# Patient Record
Sex: Male | Born: 1995 | Race: White | Hispanic: No | Marital: Single | Smoking: Never smoker
Health system: Southern US, Community
[De-identification: ages and names within clinical notes are randomized; demographics above are authoritative.]

## PROBLEM LIST (undated history)

## (undated) DIAGNOSIS — R519 Headache, unspecified: Secondary | ICD-10-CM

## (undated) DIAGNOSIS — R51 Headache: Secondary | ICD-10-CM

## (undated) HISTORY — PX: TONSILLECTOMY: SUR1361

## (undated) HISTORY — PX: ADENOIDECTOMY: SUR15

## (undated) HISTORY — PX: TYMPANOSTOMY TUBE PLACEMENT: SHX32

## (undated) HISTORY — PX: EAR TUBE REMOVAL: SHX1486

---
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
## 2016-07-15 NOTE — Discharge Instructions (Signed)
[facility] °[facility] °ENDOSCOPIC SINUS SURGERY °[facility] ° °What is Functional Endoscopic Sinus Surgery? ° The Surgery involves making the natural openings of the sinuses larger by removing the bony partitions that separate the sinuses from the nasal cavity.  The natural sinus lining is preserved as much as possible to allow the sinuses to resume normal function after the surgery.  In some patients nasal polyps (excessively swollen lining of the sinuses) may be removed to relieve obstruction of the sinus openings.  The surgery is performed through the nose using lighted scopes, which eliminates the need for incisions on the face.  A septoplasty is a different procedure which is sometimes performed with sinus surgery.  It involves straightening the boy partition that separates the two sides of your nose.  A crooked or deviated septum may need repair if is obstructing the sinuses or nasal airflow.  Turbinate reduction is also often performed during sinus surgery.  The turbinates are bony proturberances from the side walls of the nose which swell and can obstruct the nose in patients with sinus and allergy problems.  Their size can be surgically reduced to help relieve nasal obstruction. ° °What Can Sinus Surgery Do For Me? ° Sinus surgery can reduce the frequency of sinus infections requiring antibiotic treatment.  This can provide improvement in nasal congestion, post-nasal drainage, facial pressure and nasal obstruction.  Surgery will NOT prevent you from ever having an infection again, so it usually only for patients who get infections 4 or more times yearly requiring antibiotics, or for infections that do not clear with antibiotics.  It will not cure nasal allergies, so patients with allergies may still require medication to treat their allergies after surgery. Surgery may improve headaches related to sinusitis, however, some people will continue to  require medication to control sinus headaches related to allergies.  Surgery will do nothing for other forms of headache (migraine, tension or cluster). ° °What Are the Risks of Endoscopic Sinus Surgery? ° Current techniques allow surgery to be performed safely with little risk, however, there are rare complications that patients should be aware of.  Because the sinuses are located around the eyes, there is risk of eye injury, including blindness, though again, this would be quite rare. This is usually a result of bleeding behind the eye during surgery, which puts the vision oat risk, though there are treatments to protect the vision and prevent permanent disrupted by surgery causing a leak of the spinal fluid that surrounds the brain.  More serious complications would include bleeding inside the brain cavity or damage to the brain.  Again, all of these complications are uncommon, and spinal fluid leaks can be safely managed surgically if they occur.  The most common complication of sinus surgery is bleeding from the nose, which may require packing or cauterization of the nose.  Continued sinus have polyps may experience recurrence of the polyps requiring revision surgery.  Alterations of sense of smell or injury to the tear ducts are also rare complications.  ° °What is the Surgery Like, and what is the Recovery? ° The Surgery usually takes a couple of hours to perform, and is usually performed under a general anesthetic (completely asleep).  Patients are usually discharged home after a couple of hours.  Sometimes during surgery it is necessary to pack the nose to control bleeding, and the packing is left in place for 24 - 48 hours, and removed by your surgeon.    If a septoplasty was performed during the procedure, there is often a splint placed which must be removed after 5-7 days.   °Discomfort: Pain is usually mild to moderate, and can be controlled by prescription pain medication or acetaminophen (Tylenol).   Aspirin, Ibuprofen (Advil, Motrin), or Naprosyn (Aleve) should be avoided, as they can cause increased bleeding.  Most patients feel sinus pressure like they have a bad head cold for several days.  Sleeping with your head elevated can help reduce swelling and facial pressure, as can ice packs over the face.  A humidifier may be helpful to keep the mucous and blood from drying in the nose.  ° °Diet: There are no specific diet restrictions, however, you should generally start with clear liquids and a light diet of bland foods because the anesthetic can cause some nausea.  Advance your diet depending on how your stomach feels.  Taking your pain medication with food will often help reduce stomach upset which pain medications can cause. ° °Nasal Saline Irrigation: It is important to remove blood clots and dried mucous from the nose as it is healing.  This is done by having you irrigate the nose at least 3 - 4 times daily with a salt water solution.  We recommend using NeilMed Sinus Rinse (available at the drug store).  Fill the squeeze bottle with the solution, bend over a sink, and insert the tip of the squeeze bottle into the nose ½ of an inch.  Point the tip of the squeeze bottle towards the inside corner of the eye on the same side your irrigating.  Squeeze the bottle and gently irrigate the nose.  If you bend forward as you do this, most of the fluid will flow back out of the nose, instead of down your throat.   The solution should be warm, near body temperature, when you irrigate.   Each time you irrigate, you should use a full squeeze bottle.  ° °Note that if you are instructed to use Nasal Steroid Sprays at any time after your surgery, irrigate with saline BEFORE using the steroid spray, so you do not wash it all out of the nose. °Another product, Nasal Saline Gel (such as AYR Nasal Saline Gel) can be applied in each nostril 3 - 4 times daily to moisture the nose and reduce scabbing or crusting. ° °Bleeding:   Bloody drainage from the nose can be expected for several days, and patients are instructed to irrigate their nose frequently with salt water to help remove mucous and blood clots.  The drainage may be dark red or brown, though some fresh blood may be seen intermittently, especially after irrigation.  Do not blow you nose, as bleeding may occur. If you must sneeze, keep your mouth open to allow air to escape through your mouth. ° °If heavy bleeding occurs: Irrigate the nose with saline to rinse out clots, then spray the nose 3 - 4 times with Afrin Nasal Decongestant Spray.  The spray will constrict the blood vessels to slow bleeding.  Pinch the lower half of your nose shut to apply pressure, and lay down with your head elevated.  Ice packs over the nose may help as well. If bleeding persists despite these measures, you should notify your doctor.  Do not use the Afrin routinely to control nasal congestion after surgery, as it can result in worsening congestion and may affect healing.  ° °Activity: Return to work varies among patients. Most patients will be out of   work at least 5 - 7 days to recover.  Patient may return to work after they are off of narcotic pain medication, and feeling well enough to perform the functions of their job.  Patients must avoid heavy lifting (over 10 pounds) or strenuous physical for 2 weeks after surgery, so your employer may need to assign you to light duty, or keep you out of work longer if light duty is not possible.  NOTE: you should not drive, operate dangerous machinery, do any mentally demanding tasks or make any important legal or financial decisions while on narcotic pain medication and recovering from the general anesthetic.  °  °Call Your Doctor Immediately if You Have Any of the Following: °1. Bleeding that you cannot control with the above measures °2. Loss of vision, double vision, bulging of the eye or black eyes. °3. Fever over 101 degrees °4. Neck stiffness with severe  headache, fever, nausea and change in mental state. °You are always encourage to call anytime with concerns, however, please call with requests for pain medication refills during office hours. ° °Office Endoscopy: During follow-up visits your doctor will remove any packing or splints that may have been placed and evaluate and clean your sinuses endoscopically.  Topical anesthetic will be used to make this as comfortable as possible, though you may want to take your pain medication prior to the visit.  How often this will need to be done varies from patient to patient.  After complete recovery from the surgery, you may need follow-up endoscopy from time to time, particularly if there is concern of recurrent infection or nasal polyps. ° ° °General Anesthesia, Adult, Care After °These instructions provide you with information about caring for yourself after your procedure. Your health care provider may also give you more specific instructions. Your treatment has been planned according to current medical practices, but problems sometimes occur. Call your health care provider if you have any problems or questions after your procedure. °What can I expect after the procedure? °After the procedure, it is common to have: °· Vomiting. °· A sore throat. °· Mental slowness. °It is common to feel: °· Nauseous. °· Cold or shivery. °· Sleepy. °· Tired. °· Sore or achy, even in parts of your body where you did not have surgery. °Follow these instructions at home: °For at least 24 hours after the procedure: °· Do not: °¨ Participate in activities where you could fall or become injured. °¨ Drive. °¨ Use heavy machinery. °¨ Drink alcohol. °¨ Take sleeping pills or medicines that cause drowsiness. °¨ Make important decisions or sign legal documents. °¨ Take care of children on your own. °· Rest. °Eating and drinking °· If you vomit, drink water, juice, or soup when you can drink without vomiting. °· Drink enough fluid to keep your  urine clear or pale yellow. °· Make sure you have little or no nausea before eating solid foods. °· Follow the diet recommended by your health care provider. °General instructions °· Have a responsible adult stay with you until you are awake and alert. °· Return to your normal activities as told by your health care provider. Ask your health care provider what activities are safe for you. °· Take over-the-counter and prescription medicines only as told by your health care provider. °· If you smoke, do not smoke without supervision. °· Keep all follow-up visits as told by your health care provider. This is important. °Contact a health care provider if: °· You continue to have nausea   or vomiting at home, and medicines are not helpful. °· You cannot drink fluids or start eating again. °· You cannot urinate after 8-12 hours. °· You develop a skin rash. °· You have fever. °· You have increasing redness at the site of your procedure. °Get help right away if: °· You have difficulty breathing. °· You have chest pain. °· You have unexpected bleeding. °· You feel that you are having a life-threatening or urgent problem. °This information is not intended to replace advice given to you by your health care provider. Make sure you discuss any questions you have with your health care provider. °Document Released: 10/24/2000 Document Revised: 12/21/2015 Document Reviewed: 07/02/2015 °Elsevier Interactive Patient Education © 2017 [employer] ° °

## 2016-07-18 ENCOUNTER — Encounter: Payer: Self-pay | Admitting: *Deleted

## 2016-07-21 ENCOUNTER — Ambulatory Visit: Payer: 59 | Admitting: Anesthesiology

## 2016-07-21 ENCOUNTER — Encounter
Admission: RE | Disposition: A | Discharge status: Home / Self Care | Payer: Self-pay | Source: Ambulatory Visit | Attending: Otolaryngology

## 2016-07-21 ENCOUNTER — Ambulatory Visit
Admission: RE | Admit: 2016-07-21 | Discharge: 2016-07-21 | Disposition: A | Discharge status: Home / Self Care | Payer: 59 | Source: Ambulatory Visit | Attending: Otolaryngology | Admitting: Otolaryngology

## 2016-07-21 DIAGNOSIS — J343 Hypertrophy of nasal turbinates: Secondary | ICD-10-CM | POA: Insufficient documentation

## 2016-07-21 DIAGNOSIS — J342 Deviated nasal septum: Secondary | ICD-10-CM | POA: Insufficient documentation

## 2016-07-21 HISTORY — PX: SEPTOPLASTY: SHX2393

## 2016-07-21 HISTORY — PX: TURBINATE REDUCTION: SHX6157

## 2016-07-21 HISTORY — DX: Headache, unspecified: R51.9

## 2016-07-21 HISTORY — DX: Headache: R51

## 2016-07-21 HISTORY — PX: ENDOSCOPIC CONCHA BULLOSA RESECTION: SHX6395

## 2016-07-21 SURGERY — SEPTOPLASTY, NOSE
Anesthesia: General | Site: Nose | Wound class: Clean Contaminated

## 2016-07-21 MED ORDER — DEXAMETHASONE SODIUM PHOSPHATE 4 MG/ML IJ SOLN
INTRAMUSCULAR | Status: DC | PRN
  Administered 2016-07-21: 10 mg via INTRAVENOUS

## 2016-07-21 MED ORDER — PROPOFOL 10 MG/ML IV BOLUS
INTRAVENOUS | Status: DC | PRN
  Administered 2016-07-21: 200 mg via INTRAVENOUS

## 2016-07-21 MED ORDER — SUCCINYLCHOLINE CHLORIDE 20 MG/ML IJ SOLN
INTRAMUSCULAR | Status: DC | PRN
  Administered 2016-07-21: 100 mg via INTRAVENOUS

## 2016-07-21 MED ORDER — PHENYLEPHRINE HCL 0.5 % NA SOLN
NASAL | Status: DC | PRN
  Administered 2016-07-21: 30 mL via TOPICAL

## 2016-07-21 MED ORDER — ACETAMINOPHEN 10 MG/ML IV SOLN
1000.0000 mg | Freq: Once | INTRAVENOUS | Status: AC
  Administered 2016-07-21: 1000 mg via INTRAVENOUS

## 2016-07-21 MED ORDER — LIDOCAINE-EPINEPHRINE (PF) 1 %-1:200000 IJ SOLN
INTRAMUSCULAR | Status: DC | PRN
  Administered 2016-07-21: 6 mL

## 2016-07-21 MED ORDER — CEFAZOLIN SODIUM-DEXTROSE 2-4 GM/100ML-% IV SOLN
2.0000 g | Freq: Once | INTRAVENOUS | Status: AC
  Administered 2016-07-21: 2 g via INTRAVENOUS

## 2016-07-21 MED ORDER — OXYMETAZOLINE HCL 0.05 % NA SOLN
2.0000 | Freq: Once | NASAL | Status: AC
  Administered 2016-07-21: 2 via NASAL

## 2016-07-21 MED ORDER — OXYCODONE HCL 5 MG PO TABS
5.0000 mg | ORAL_TABLET | Freq: Once | ORAL | Status: AC | PRN
  Administered 2016-07-21: 5 mg via ORAL

## 2016-07-21 MED ORDER — FENTANYL CITRATE (PF) 100 MCG/2ML IJ SOLN
25.0000 ug | INTRAMUSCULAR | Status: DC | PRN
  Administered 2016-07-21: 25 ug via INTRAVENOUS
  Administered 2016-07-21: 50 ug via INTRAVENOUS

## 2016-07-21 MED ORDER — LACTATED RINGERS IV SOLN
INTRAVENOUS | Status: DC
  Administered 2016-07-21: 09:00:00 via INTRAVENOUS

## 2016-07-21 MED ORDER — FENTANYL CITRATE (PF) 100 MCG/2ML IJ SOLN
INTRAMUSCULAR | Status: DC | PRN
  Administered 2016-07-21: 100 ug via INTRAVENOUS

## 2016-07-21 MED ORDER — MIDAZOLAM HCL 5 MG/5ML IJ SOLN
INTRAMUSCULAR | Status: DC | PRN
  Administered 2016-07-21: 2 mg via INTRAVENOUS

## 2016-07-21 MED ORDER — ONDANSETRON HCL 4 MG/2ML IJ SOLN
INTRAMUSCULAR | Status: DC | PRN
  Administered 2016-07-21: 4 mg via INTRAVENOUS

## 2016-07-21 MED ORDER — SCOPOLAMINE 1 MG/3DAYS TD PT72
1.0000 | MEDICATED_PATCH | TRANSDERMAL | Status: DC
  Administered 2016-07-21: 1.5 mg via TRANSDERMAL

## 2016-07-21 MED ORDER — LIDOCAINE HCL (CARDIAC) 20 MG/ML IV SOLN
INTRAVENOUS | Status: DC | PRN
  Administered 2016-07-21: 80 mg via INTRAVENOUS

## 2016-07-21 MED ORDER — OXYCODONE HCL 5 MG/5ML PO SOLN: 5.0000 mg | Freq: Once | ORAL | Status: AC | PRN

## 2016-07-21 SURGICAL SUPPLY — 29 items
CANISTER SUCT 1200ML W/VALVE (MISCELLANEOUS) ×4 IMPLANT
CATH IV 18X1 1/4 SAFELET (CATHETERS) ×4 IMPLANT
COAGULATOR SUCT 8FR VV (MISCELLANEOUS) ×4 IMPLANT
DRAPE HEAD BAR (DRAPES) ×4 IMPLANT
GLOVE PI ULTRA LF STRL 7.5 (GLOVE) ×6 IMPLANT
GLOVE PI ULTRA NON LATEX 7.5 (GLOVE) ×6
IV CATH 18X1 1/4 SAFELET (CATHETERS) ×2
IV NS 500ML (IV SOLUTION) ×2
IV NS 500ML BAXH (IV SOLUTION) ×2 IMPLANT
KIT ROOM TURNOVER OR (KITS) ×4 IMPLANT
NEEDLE ANESTHESIA  27G X 3.5 (NEEDLE) ×2
NEEDLE ANESTHESIA 27G X 3.5 (NEEDLE) ×2 IMPLANT
NS IRRIG 500ML POUR BTL (IV SOLUTION) ×4 IMPLANT
PACK DRAPE NASAL/ENT (PACKS) ×4 IMPLANT
PACKING NASAL EPIS 4X2.4 XEROG (MISCELLANEOUS) ×4 IMPLANT
PAD GROUND ADULT SPLIT (MISCELLANEOUS) ×4 IMPLANT
PATTIES SURGICAL .5 X3 (DISPOSABLE) ×4 IMPLANT
SOL ANTI-FOG 6CC FOG-OUT (MISCELLANEOUS) ×2 IMPLANT
SOL FOG-OUT ANTI-FOG 6CC (MISCELLANEOUS) ×2
SPLINT NASAL SEPTAL BLV .50 ST (MISCELLANEOUS) ×4 IMPLANT
STRAP BODY AND KNEE 60X3 (MISCELLANEOUS) ×8 IMPLANT
SUT CHROMIC 3-0 (SUTURE)
SUT CHROMIC 3-0 KS 27XMFL CR (SUTURE)
SUT ETHILON 3-0 KS 30 BLK (SUTURE) ×4 IMPLANT
SUT PLAIN GUT 4-0 (SUTURE) ×4 IMPLANT
SUTURE CHRMC 3-0 KS 27XMFL CR (SUTURE) IMPLANT
SYR 3ML LL SCALE MARK (SYRINGE) ×4 IMPLANT
TOWEL OR 17X26 4PK STRL BLUE (TOWEL DISPOSABLE) ×4 IMPLANT
WATER STERILE IRR 250ML POUR (IV SOLUTION) ×4 IMPLANT

## 2016-07-21 NOTE — Op Note (Signed)
07/21/2016  10:51 AM  [id number]   Pre-Op Dx:  Deviated Nasal Septum, conchal bullosa of the middle turbinates, Hypertrophic Inferior Turbinates  Post-op Dx: Same  Proc: Nasal Septoplasty, endoscopic trimming of the conchal bullosa of the middle turbinates bilaterally, Bilateral Partial Reduction Inferior Turbinates   Surg:  Sharla Tankard H  Anes:  GOT  EBL:  100 mL  Comp:  None  Findings: She had very deviated septum mostly the left side. The middle turbinate was extremely large and thickened with lots of bone but he had conchal bullosa and both middle turbinates. The inferior turbinates were enlarged especially the right side that required trimming as well.  Procedure: With the patient in a comfortable supine position,  general orotracheal anesthesia was induced without difficulty.     The patient received preoperative Afrin spray for topical decongestion and vasoconstriction.  Intravenous prophylactic antibiotics were administered.  At an appropriate level, the patient was placed in a semi-sitting position.  Nasal vibrissae were trimmed.   1% Xylocaine with 1:200,000 epinephrine, 6 cc's, was infiltrated into the anterior floor of the nose, into the nasal spine region, into the membranous columella, and finally into the submucoperichondrial plane of the septum on both sides.  Several minutes were allowed for this to take effect.  Cottoniod pledgetts soaked in Afrin and 4% Xylocaine were placed into both nasal cavities and left while the patient was prepped and draped in the standard fashion.  The materials were removed from the nose and observed to be intact and correct in number.  The nose was inspected with a headlight and the 0 scope with the findings as described above.  A left Killian incision was sharply executed and carried down to the quadrangular cartilage. The mucoperichondrium was elelvated along the quadrangular plate back to the bony-cartilaginous junction. The  mucoperiostium was then elevated along the ethmoid plate and the vomer. The boney-catilaginous junction was then split with a freer elevator and the mucoperiosteum was elevated on the opposite side. The mucoperiosteum was then elevated along the maxillary crest as needed to expose the crooked bone of the crest.  Boney spurs of the vomer and maxillary crest were removed with Lenoria Chimeakahashi forceps.  The cartilaginous plate was trimmed along its posterior and inferior borders of about 2 mm of cartilage to free it up inferiorly. Some of the deviated ethmoid plate was then fractured and removed with Takahashi forceps to free up the posterior border of the quadrangular plate and allow it to swing back to the midline. The mucosal flaps were placed back into their anatomic position to allow visualization of the airways. The septum now sat in the midline with an improved airway.  A 3-0 Chromic suture on a Keith needle in used to anchor the inferior septum at the nasal spine with a through and through suture. The mucosal flaps are then sutured together using a through and through whip stitch of 4-0 Plain Gut  a mini-Keith needle. This was used to close the HoutzdaleKillian incision as well.   The 0 scope was then used to visualize the airways on both sides. The right middle turbinate was extremely enlarged and filling the upper airway. A Gruenwald forcep was used for trimming along its anterior and inferior borders laterally to create low more room in the front of the upper nose. There is a conchal bullosa in the upper portion and the lateral wall of this was removed. After trimming the her wall was cauterized with electrocautery to help control bleeding here. The  gel was placed overlying the trimmed tissues for dressing. The left side was visualized the 0 scope and the middle turbinate was again trimmed along its anterior border. The conchal bullosa was opened and the lateral wall this was removed as well. Cautery was again used  along the trimmed edges.  The inferior turbinates were then inspected. An incision was created along the inferior aspect of the left inferior turbinate with removal of some of the inferior soft tissue and bone. Electrocautery was used to control bleeding in the area. The remaining turbinate was then outfractured to open up the airway further. There was no significant bleeding noted. The right turbinate was then trimmed and outfractured in a similar fashion.  The airways were then visualized and showed open passageways on both sides that were significantly improved compared to before surgery. There was no signifcant bleeding. Nasal splints were applied to both sides of the septum using Xomed 0.725mm regular sized splints that were trimmed, and then held in position with a 3-0 Nylon through and through suture.  The patient was turned back over to anesthesia, and awakened, extubated, and taken to the PACU in satisfactory condition.  Dispo:   PACU to home  Plan: Ice, elevation, narcotic analgesia, steroid taper, and prophylactic antibiotics for the duration of indwelling nasal foreign bodies.  We will reevaluate the patient in the office in 6 days and remove the septal splints.  Return to work in 10 days, strenuous activities in two weeks.   Janely Gullickson H 07/21/2016 10:51 AM

## 2016-07-21 NOTE — Anesthesia Postprocedure Evaluation (Signed)
Anesthesia Post Note  Patient: Luana ShuGaven L Stahly  Procedure(s) Performed: Procedure(s) (LRB): SEPTOPLASTY (N/A) ENDOSCOPIC CONCHA BULLOSA RESECTION (Bilateral) PARTIAL INFERIOR TURBINATE REDUCTION (Bilateral)  Patient location during evaluation: PACU Anesthesia Type: General Level of consciousness: awake Pain management: pain level controlled Vital Signs Assessment: post-procedure vital signs reviewed and stable Respiratory status: spontaneous breathing Cardiovascular status: blood pressure returned to baseline Postop Assessment: no headache Anesthetic complications: no    Verner Cholunkle, III,  Noriah Osgood D

## 2016-07-21 NOTE — H&P (Signed)
  H&P has been reviewed and no changes necessary. To be downloaded later. 

## 2016-07-21 NOTE — Transfer of Care (Signed)
Immediate Anesthesia Transfer of Care Note  Patient: Derrick Summers  Procedure(s) Performed: Procedure(s): SEPTOPLASTY (N/A) ENDOSCOPIC CONCHA BULLOSA RESECTION (Bilateral) PARTIAL INFERIOR TURBINATE REDUCTION (Bilateral)  Patient Location: PACU  Anesthesia Type: General ETT  Level of Consciousness: awake, alert  and patient cooperative  Airway and Oxygen Therapy: Patient Spontanous Breathing and Patient connected to supplemental oxygen  Post-op Assessment: Post-op Vital signs reviewed, Patient's Cardiovascular Status Stable, Respiratory Function Stable, Patent Airway and No signs of Nausea or vomiting  Post-op Vital Signs: Reviewed and stable  Complications: No apparent anesthesia complications

## 2016-07-21 NOTE — Anesthesia Procedure Notes (Signed)
Procedure Name: Intubation Date/Time: 07/21/2016 9:46 AM Performed by: Maryan RuedWILSON, Derrick Zanders M Pre-anesthesia Checklist: Patient identified, Emergency Drugs available, Suction available and Patient being monitored Patient Re-evaluated:Patient Re-evaluated prior to inductionOxygen Delivery Method: Circle system utilized Preoxygenation: Pre-oxygenation with 100% oxygen Intubation Type: IV induction Ventilation: Mask ventilation without difficulty Grade View: Grade I Tube type: Oral Rae Number of attempts: 2 Intubation method: glidescope. Placement Confirmation: ETT inserted through vocal cords under direct vision and positive ETCO2 Tube secured with: Tape

## 2016-07-21 NOTE — Anesthesia Preprocedure Evaluation (Signed)
Anesthesia Evaluation  Patient identified by MRN, date of birth, ID band Patient awake    Reviewed: Allergy & Precautions, H&P , NPO status , Patient's Chart, lab work & pertinent test results  Airway Mallampati: II  TM Distance: >3 FB Neck ROM: full    Dental no notable dental hx.    Pulmonary neg pulmonary ROS,    Pulmonary exam normal breath sounds clear to auscultation       Cardiovascular negative cardio ROS Normal cardiovascular exam     Neuro/Psych    GI/Hepatic negative GI ROS, Neg liver ROS,   Endo/Other  negative endocrine ROS  Renal/GU negative Renal ROS     Musculoskeletal   Abdominal   Peds  Hematology negative hematology ROS (+)   Anesthesia Other Findings   Reproductive/Obstetrics                             Anesthesia Physical Anesthesia Plan  ASA: I  Anesthesia Plan: General ETT   Post-op Pain Management:    Induction:   Airway Management Planned:   Additional Equipment:   Intra-op Plan:   Post-operative Plan:   Informed Consent: I have reviewed the patients History and Physical, chart, labs and discussed the procedure including the risks, benefits and alternatives for the proposed anesthesia with the patient or authorized representative who has indicated his/her understanding and acceptance.     Plan Discussed with:   Anesthesia Plan Comments:         Anesthesia Quick Evaluation

## 2016-07-22 ENCOUNTER — Encounter: Payer: Self-pay | Admitting: Otolaryngology
